# Patient Record
Sex: Female | Born: 1970 | Race: Black or African American | Hispanic: No | Marital: Single | Smoking: Never smoker
Health system: Southern US, Community
[De-identification: ages and names within clinical notes are randomized; demographics above are authoritative.]

---

## 1998-10-03 ENCOUNTER — Emergency Department (HOSPITAL_COMMUNITY): Admission: EM | Admit: 1998-10-03 | Discharge: 1998-10-03 | Payer: Self-pay | Admitting: Emergency Medicine

## 1998-10-06 ENCOUNTER — Inpatient Hospital Stay (HOSPITAL_COMMUNITY): Admission: AD | Admit: 1998-10-06 | Discharge: 1998-10-06 | Payer: Self-pay | Admitting: Obstetrics

## 1998-10-08 ENCOUNTER — Inpatient Hospital Stay (HOSPITAL_COMMUNITY): Admission: AD | Admit: 1998-10-08 | Discharge: 1998-10-08 | Payer: Self-pay | Admitting: Obstetrics

## 1999-04-03 ENCOUNTER — Encounter: Payer: Self-pay | Admitting: *Deleted

## 1999-04-03 ENCOUNTER — Ambulatory Visit (HOSPITAL_COMMUNITY): Admission: AD | Admit: 1999-04-03 | Discharge: 1999-04-03 | Payer: Self-pay | Admitting: *Deleted

## 2000-03-11 ENCOUNTER — Other Ambulatory Visit: Admission: RE | Admit: 2000-03-11 | Discharge: 2000-03-11 | Payer: Self-pay | Admitting: Obstetrics and Gynecology

## 2000-10-07 ENCOUNTER — Inpatient Hospital Stay (HOSPITAL_COMMUNITY): Admission: AD | Admit: 2000-10-07 | Discharge: 2000-10-09 | Payer: Self-pay | Admitting: Obstetrics and Gynecology

## 2010-09-26 ENCOUNTER — Inpatient Hospital Stay (INDEPENDENT_AMBULATORY_CARE_PROVIDER_SITE_OTHER)
Admission: RE | Admit: 2010-09-26 | Discharge: 2010-09-26 | Disposition: A | Discharge status: Home / Self Care | Payer: Self-pay | Source: Ambulatory Visit | Attending: Family Medicine | Admitting: Family Medicine

## 2010-09-26 ENCOUNTER — Ambulatory Visit (INDEPENDENT_AMBULATORY_CARE_PROVIDER_SITE_OTHER): Payer: Self-pay

## 2010-09-26 DIAGNOSIS — J40 Bronchitis, not specified as acute or chronic: Secondary | ICD-10-CM

## 2010-09-26 DIAGNOSIS — J45909 Unspecified asthma, uncomplicated: Secondary | ICD-10-CM

## 2010-11-20 ENCOUNTER — Inpatient Hospital Stay (INDEPENDENT_AMBULATORY_CARE_PROVIDER_SITE_OTHER)
Admission: RE | Admit: 2010-11-20 | Discharge: 2010-11-20 | Disposition: A | Discharge status: Home / Self Care | Payer: Medicaid Other | Source: Ambulatory Visit | Attending: Emergency Medicine | Admitting: Emergency Medicine

## 2010-11-20 ENCOUNTER — Ambulatory Visit (INDEPENDENT_AMBULATORY_CARE_PROVIDER_SITE_OTHER): Payer: Medicaid Other

## 2010-11-20 DIAGNOSIS — J45909 Unspecified asthma, uncomplicated: Secondary | ICD-10-CM

## 2010-11-20 LAB — COMPREHENSIVE METABOLIC PANEL
ALT: 14 U/L (ref 0–35)
AST: 22 U/L (ref 0–37)
Albumin: 3.8 g/dL (ref 3.5–5.2)
Alkaline Phosphatase: 60 U/L (ref 39–117)
BUN: 20 mg/dL (ref 6–23)
CO2: 27 mEq/L (ref 19–32)
Calcium: 8.9 mg/dL (ref 8.4–10.5)
Chloride: 104 mEq/L (ref 96–112)
Creatinine, Ser: 0.76 mg/dL (ref 0.4–1.2)
GFR calc Af Amer: >60 mL/min (ref >60)
GFR calc non Af Amer: >60 mL/min (ref >60)
Glucose, Bld: 80 mg/dL (ref 70–99)
Potassium: 4.2 mEq/L (ref 3.5–5.1)
Sodium: 135 mEq/L (ref 135–145)
Total Bilirubin: 0.7 mg/dL (ref 0.3–1.2)
Total Protein: 7.1 g/dL (ref 6.0–8.3)

## 2010-11-20 LAB — DIFFERENTIAL
Basophils Absolute: 0 10*3/uL (ref 0.0–0.1)
Basophils Relative: 1 % (ref 0–1)
Eosinophils Absolute: 0.1 10*3/uL (ref 0.0–0.7)
Eosinophils Relative: 2 % (ref 0–5)
Lymphocytes Relative: 35 % (ref 12–46)
Lymphs Abs: 1.8 10*3/uL (ref 0.7–4.0)
Monocytes Absolute: 0.4 10*3/uL (ref 0.1–1.0)
Monocytes Relative: 8 % (ref 3–12)
Neutro Abs: 2.9 10*3/uL (ref 1.7–7.7)
Neutrophils Relative %: 55 % (ref 43–77)

## 2010-11-20 LAB — CBC
HCT: 39 % (ref 36.0–46.0)
Hemoglobin: 13.2 g/dL (ref 12.0–15.0)
MCH: 30.1 pg (ref 26.0–34.0)
MCHC: 33.8 g/dL (ref 30.0–36.0)
MCV: 89 fL (ref 78.0–100.0)
Platelets: 190 10*3/uL (ref 150–400)
RBC: 4.38 MIL/uL (ref 3.87–5.11)
RDW: 11.9 % (ref 11.5–15.5)
WBC: 5.3 10*3/uL (ref 4.0–10.5)

## 2010-11-21 LAB — TSH: TSH: 1.778 u[IU]/mL (ref 0.350–4.500)

## 2011-08-19 ENCOUNTER — Telehealth (HOSPITAL_COMMUNITY): Payer: Self-pay | Admitting: *Deleted

## 2011-08-23 ENCOUNTER — Telehealth (HOSPITAL_COMMUNITY): Payer: Self-pay | Admitting: *Deleted

## 2011-12-28 ENCOUNTER — Other Ambulatory Visit: Payer: Self-pay | Admitting: Nephrology

## 2019-01-24 ENCOUNTER — Emergency Department (HOSPITAL_COMMUNITY): Payer: Self-pay

## 2019-01-24 ENCOUNTER — Other Ambulatory Visit: Payer: Self-pay

## 2019-01-24 ENCOUNTER — Encounter (HOSPITAL_COMMUNITY): Payer: Self-pay

## 2019-01-24 ENCOUNTER — Emergency Department (HOSPITAL_COMMUNITY)
Admission: EM | Admit: 2019-01-24 | Discharge: 2019-01-24 | Disposition: A | Discharge status: Home / Self Care | Payer: Self-pay | Attending: Emergency Medicine | Admitting: Emergency Medicine

## 2019-01-24 DIAGNOSIS — K5909 Other constipation: Secondary | ICD-10-CM

## 2019-01-24 LAB — URINALYSIS, ROUTINE W REFLEX MICROSCOPIC
Bacteria, UA: NONE SEEN
Bilirubin Urine: NEGATIVE
Glucose, UA: NEGATIVE mg/dL
Hgb urine dipstick: NEGATIVE
Ketones, ur: 20 mg/dL — AB
Leukocytes,Ua: NEGATIVE
Nitrite: NEGATIVE
Protein, ur: 30 mg/dL — AB
Specific Gravity, Urine: 1.024 (ref 1.005–1.030)
Squamous Epithelial / LPF: >50 — ABNORMAL HIGH (ref 0–5)
pH: 5 (ref 5.0–8.0)

## 2019-01-24 LAB — CBC
HCT: 41.7 % (ref 36.0–46.0)
Hemoglobin: 13.7 g/dL (ref 12.0–15.0)
MCH: 30.4 pg (ref 26.0–34.0)
MCHC: 32.9 g/dL (ref 30.0–36.0)
MCV: 92.5 fL (ref 80.0–100.0)
Platelets: 184 10*3/uL (ref 150–400)
RBC: 4.51 MIL/uL (ref 3.87–5.11)
RDW: 11.7 % (ref 11.5–15.5)
WBC: 5 10*3/uL (ref 4.0–10.5)
nRBC: 0 % (ref 0.0–0.2)

## 2019-01-24 LAB — COMPREHENSIVE METABOLIC PANEL
ALT: 20 U/L (ref 0–44)
AST: 26 U/L (ref 15–41)
Albumin: 4.4 g/dL (ref 3.5–5.0)
Alkaline Phosphatase: 54 U/L (ref 38–126)
Anion gap: 9 (ref 5–15)
BUN: 14 mg/dL (ref 6–20)
CO2: 26 mmol/L (ref 22–32)
Calcium: 9.3 mg/dL (ref 8.9–10.3)
Chloride: 105 mmol/L (ref 98–111)
Creatinine, Ser: 0.59 mg/dL (ref 0.44–1.00)
GFR calc Af Amer: >60 mL/min (ref >60)
GFR calc non Af Amer: >60 mL/min (ref >60)
Glucose, Bld: 78 mg/dL (ref 70–99)
Potassium: 3.6 mmol/L (ref 3.5–5.1)
Sodium: 140 mmol/L (ref 135–145)
Total Bilirubin: 1.3 mg/dL — ABNORMAL HIGH (ref 0.3–1.2)
Total Protein: 7.9 g/dL (ref 6.5–8.1)

## 2019-01-24 LAB — LIPASE, BLOOD: Lipase: 25 U/L (ref 11–51)

## 2019-01-24 LAB — I-STAT BETA HCG BLOOD, ED (MC, WL, AP ONLY): I-stat hCG, quantitative: <5 m[IU]/mL (ref <5)

## 2019-01-24 MED ORDER — SENNA 8.6 MG PO TABS: 1.0000 | ORAL_TABLET | Freq: Two times a day (BID) | ORAL | 0 refills | Status: AC | PRN

## 2019-01-24 MED ORDER — SODIUM CHLORIDE 0.9% FLUSH: 3.0000 mL | Freq: Once | INTRAVENOUS | Status: DC

## 2019-01-24 MED ORDER — POLYETHYLENE GLYCOL 3350 17 GM/SCOOP PO POWD: 17.0000 g | Freq: Every day | ORAL | 0 refills | Status: AC

## 2019-01-24 NOTE — Discharge Instructions (Signed)
Your x-rays show a moderate amount of constipation.  This is best treated with Miralax to soften the stool and Senna to move the stool.  Please take as much Miralax as need to have a comfortable bowel movement, and you can take one to two Senna tablets daily as needed.  Continue to stay active and eat plenty of fiber.

## 2019-01-24 NOTE — ED Triage Notes (Signed)
Pt states that for approximately 3 weeks, she has felt the urge to have a BM but has been unable to get it out per normal. Pt states that she had a BM with a mucous plug. Pt states that her appetite is normal, but she is having some abdominal swelling. Pt states she has also had some fatigue.  Last BM this morning, but very small. RLQ pain that waxes and wanes.

## 2019-01-24 NOTE — ED Provider Notes (Signed)
[facility]-EMERGENCY DEPT Provider Note   CSN: [id number] Arrival date & time: 01/24/19  1528    History   Chief Complaint Chief Complaint  Patient presents with  . Abdominal Pain    HPI Kathy Fuller is a [age] y.o. female without significant past medical history who presents with abdominal pain.  She says that she noticed that her bowel movements decreased in frequency and quantity about 2 months ago.  She connected this with relative inactivity and eating processed foods, so she tried to walk more and more fruits and vegetables.  She also tried over-the-counter stool softeners which she thinks were Dulcolax as well as a fleets enema, neither of which relieved her symptoms or helped her have a bowel movement.  Her symptoms have worsened over the past 2 months.  She has had nausea and a feeling of fullness in her abdomen especially after eating.  She has had some vomiting as well.  She denies fevers but thinks she has had some chills.  She denies blood in her stools and painful defecation but does endorse some tenesmus.  She takes no medications other than occasional Advil.      History reviewed. No pertinent past medical history.  There are no active problems to display for this patient.   History reviewed. No pertinent surgical history.   OB History   No obstetric history on file.      Home Medications    Prior to Admission medications   Medication Sig Start Date End Date Taking? Authorizing Provider  polyethylene glycol powder (GLYCOLAX/MIRALAX) 17 GM/SCOOP powder Take 17 g by mouth daily. 01/24/19   Lennox SoldersWinfrey, Jehieli Brassell C, MD  senna (SENOKOT) 8.6 MG TABS tablet Take 1 tablet (8.6 mg total) by mouth 2 (two) times daily as needed for mild constipation or moderate constipation. 01/24/19   Lennox SoldersWinfrey, Kadynce Bonds C, MD    Family History No family history on file.  Social History Social History   Tobacco Use  . Smoking status: Never Smoker  . Smokeless tobacco:  Never Used  Substance Use Topics  . Alcohol use: Yes    Comment: socially  . Drug use: Never     Allergies   Patient has no known allergies.   Review of Systems Review of Systems  Constitutional: Positive for chills. Negative for activity change, appetite change, fatigue and fever.  HENT: Negative for congestion.   Respiratory: Negative for cough, chest tightness and shortness of breath.   Cardiovascular: Negative for chest pain and palpitations.  Gastrointestinal: Positive for abdominal pain, constipation, nausea and vomiting. Negative for blood in stool.  Genitourinary: Negative for dysuria.  Neurological: Negative for headaches.  Psychiatric/Behavioral: The patient is not nervous/anxious.      Physical Exam Updated Vital Signs BP (!) 114/56   Pulse 68   Temp 98.4 F (36.9 C) (Oral)   Resp 16   Ht 5\' 7"  (1.702 m)   Wt 59 kg   LMP 01/10/2019 (Approximate)   SpO2 100%   BMI 20.36 kg/m   Physical Exam Constitutional:      General: She is not in acute distress.    Appearance: She is well-developed and normal weight. She is not ill-appearing.  HENT:     Head: Normocephalic and atraumatic.     Mouth/Throat:     Mouth: Mucous membranes are moist.  Eyes:     Extraocular Movements: Extraocular movements intact.  Cardiovascular:     Rate and Rhythm: Normal rate and regular rhythm.  Heart sounds: Normal heart sounds.  Pulmonary:     Effort: Pulmonary effort is normal. No respiratory distress.  Abdominal:     General: Abdomen is flat. Bowel sounds are normal. There is no distension.     Palpations: There is no shifting dullness.     Tenderness: There is generalized abdominal tenderness and tenderness in the right lower quadrant. There is no guarding or rebound.  Skin:    General: Skin is warm and dry.  Neurological:     General: No focal deficit present.     Mental Status: She is alert.  Psychiatric:        Mood and Affect: Mood normal.      ED Treatments  / Results  Labs (all labs ordered are listed, but only abnormal results are displayed) Labs Reviewed  COMPREHENSIVE METABOLIC PANEL - Abnormal; Notable for the following components:      Result Value   Total Bilirubin 1.3 (*)    All other components within normal limits  URINALYSIS, ROUTINE W REFLEX MICROSCOPIC - Abnormal; Notable for the following components:   Color, Urine AMBER (*)    APPearance CLOUDY (*)    Ketones, ur 20 (*)    Protein, ur 30 (*)    Squamous Epithelial / LPF >50 (*)    All other components within normal limits  LIPASE, BLOOD  CBC  I-STAT BETA HCG BLOOD, ED ([facility], [facility], [facility] ONLY)    EKG None  Radiology Dg Abdomen Acute W/chest  Result Date: 01/24/2019 CLINICAL DATA:  Right-sided pain for 2 months EXAM: DG ABDOMEN ACUTE W/ 1V CHEST COMPARISON:  11/20/2010 FINDINGS: Cardiac shadow is within normal limits. The lungs are well aerated bilaterally. No focal infiltrate or sizable effusion is seen. Scattered large and small bowel gas is noted. No abnormal mass or abnormal calcifications are seen. No free air is noted. No bony abnormality is noted. IMPRESSION: No acute abnormality seen. Electronically Signed   By: Inez Catalina M.D.   On: 01/24/2019 18:32    Procedures Procedures (including critical care time)  Medications Ordered in ED Medications  sodium chloride flush (NS) 0.9 % injection 3 mL (has no administration in time range)     Initial Impression / Assessment and Plan / ED Course  I have reviewed the triage vital signs and the nursing notes.  Pertinent labs & imaging results that were available during my care of the patient were reviewed by me and considered in my medical decision making (see chart for details).        Patient's history is most consistent with constipation, although she is tender in the right lower quadrant rather than the left lower quadrant, which would be more expected in this case.  Lipase is normal, and CBC is normal, arguing  against pancreatitis and appendicitis respectively.  Will obtain KUB to investigate for possible small bowel obstruction, although this is unlikely, and to assess degree of constipation.  KUB shows a moderate stool burden without fecal impaction.  Patient was prescribed MiraLAX and senna and advised on how to titrate MiraLAX for effect.  She expressed full understanding and was safe for discharge.  Final Clinical Impressions(s) / ED Diagnoses   Final diagnoses:  Other constipation    ED Discharge Orders         Ordered    polyethylene glycol powder (GLYCOLAX/MIRALAX) 17 GM/SCOOP powder  Daily     01/24/19 1843    senna (SENOKOT) 8.6 MG TABS tablet  2 times daily PRN  01/24/19 1843           Lennox SoldersWinfrey, Malin Cervini C, MD 01/24/19 [employer] [id number]    Long, Joshua G, MD 01/25/19 [id number]

## 2019-01-24 NOTE — ED Notes (Signed)
Patient transported to X-ray 

## 2019-07-18 IMAGING — CR DG ABDOMEN ACUTE W/ 1V CHEST
4 series · 4 of 4 positions shown · non-contrast
Comparison: 11/20/2010

CLINICAL DATA: Right-sided pain for 2 months

EXAM:
DG ABDOMEN ACUTE W/ 1V CHEST

[w chest pa]
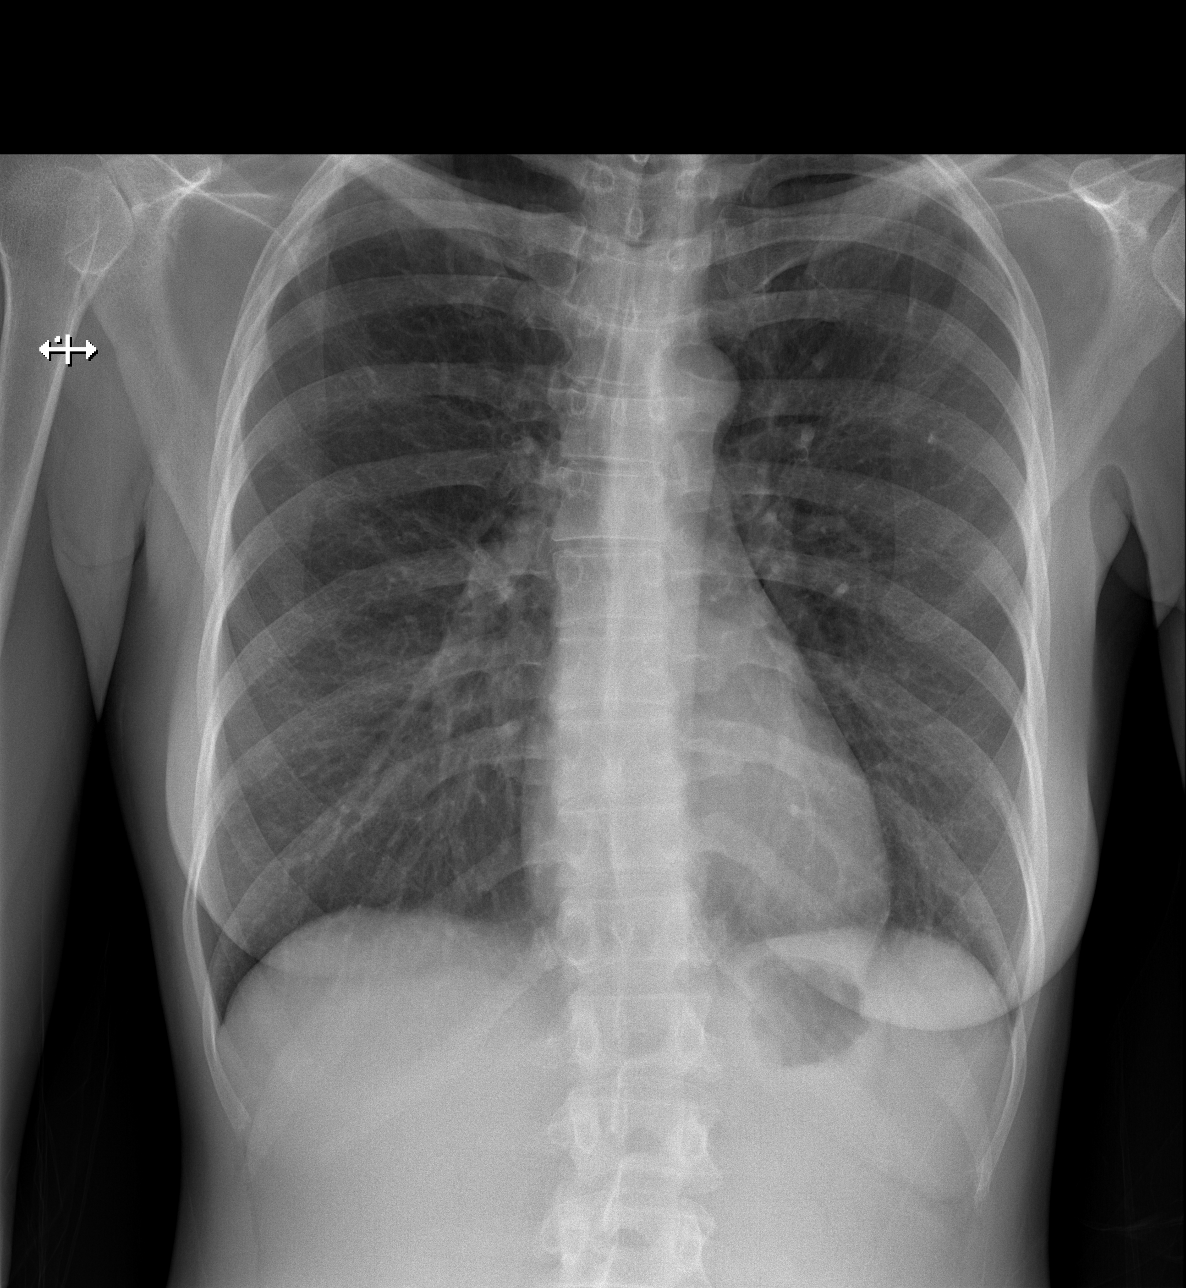

[w abdomen upright]
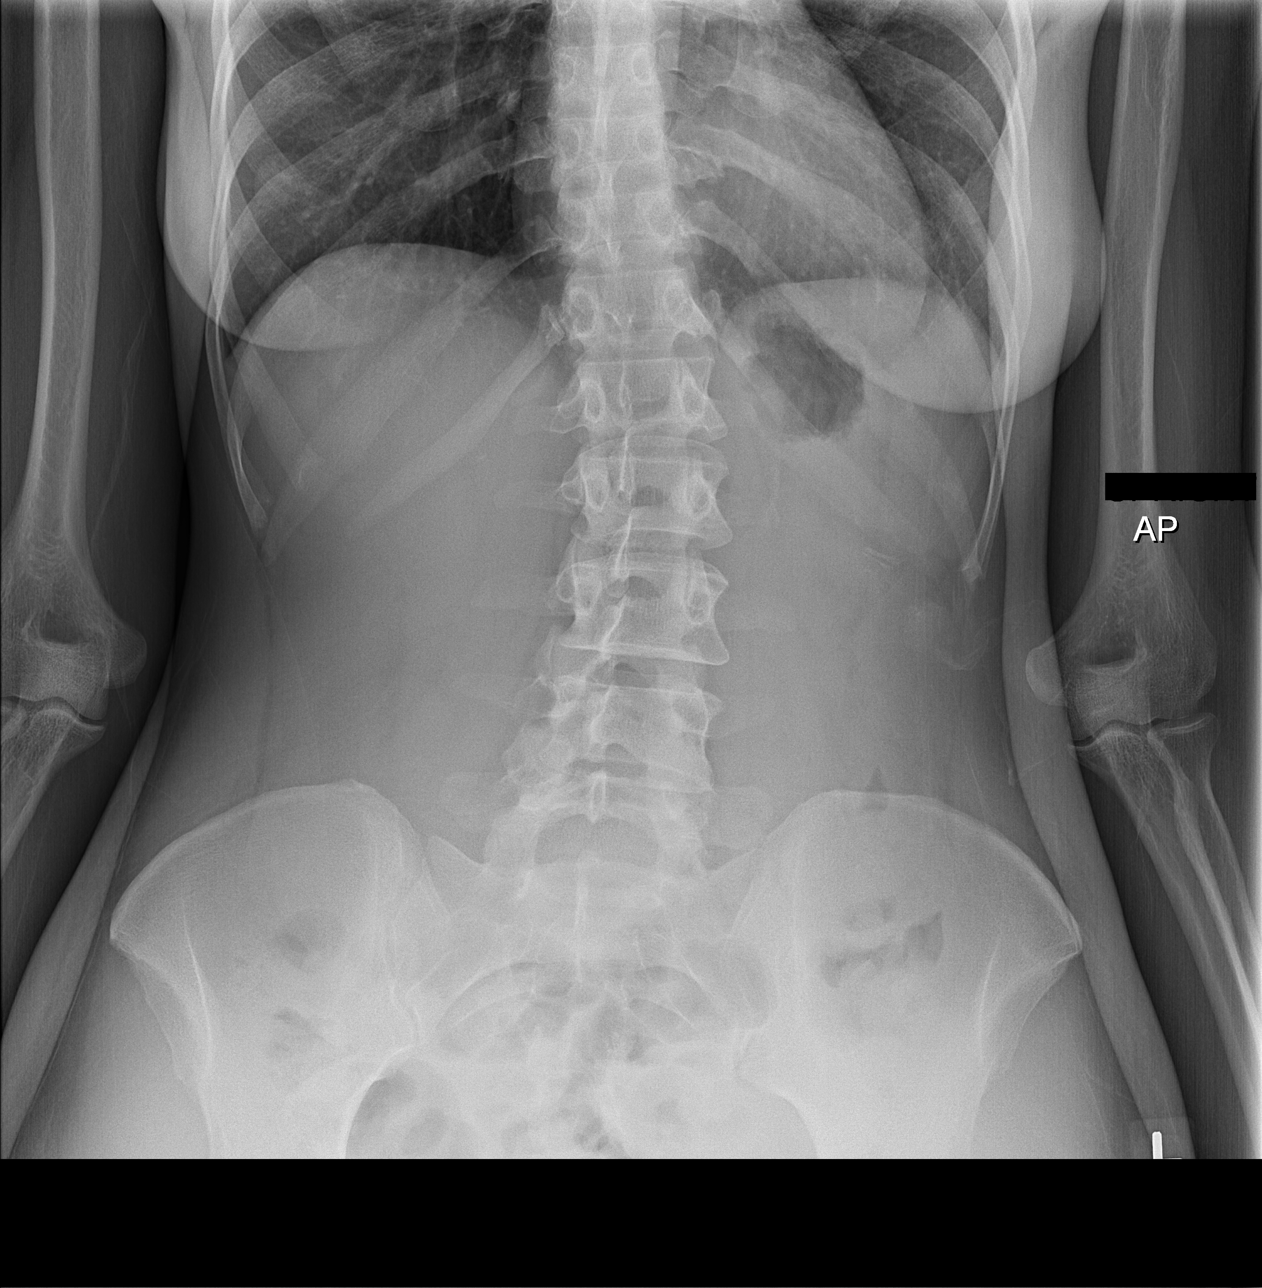

[t abdomen supine (1 of 2)]
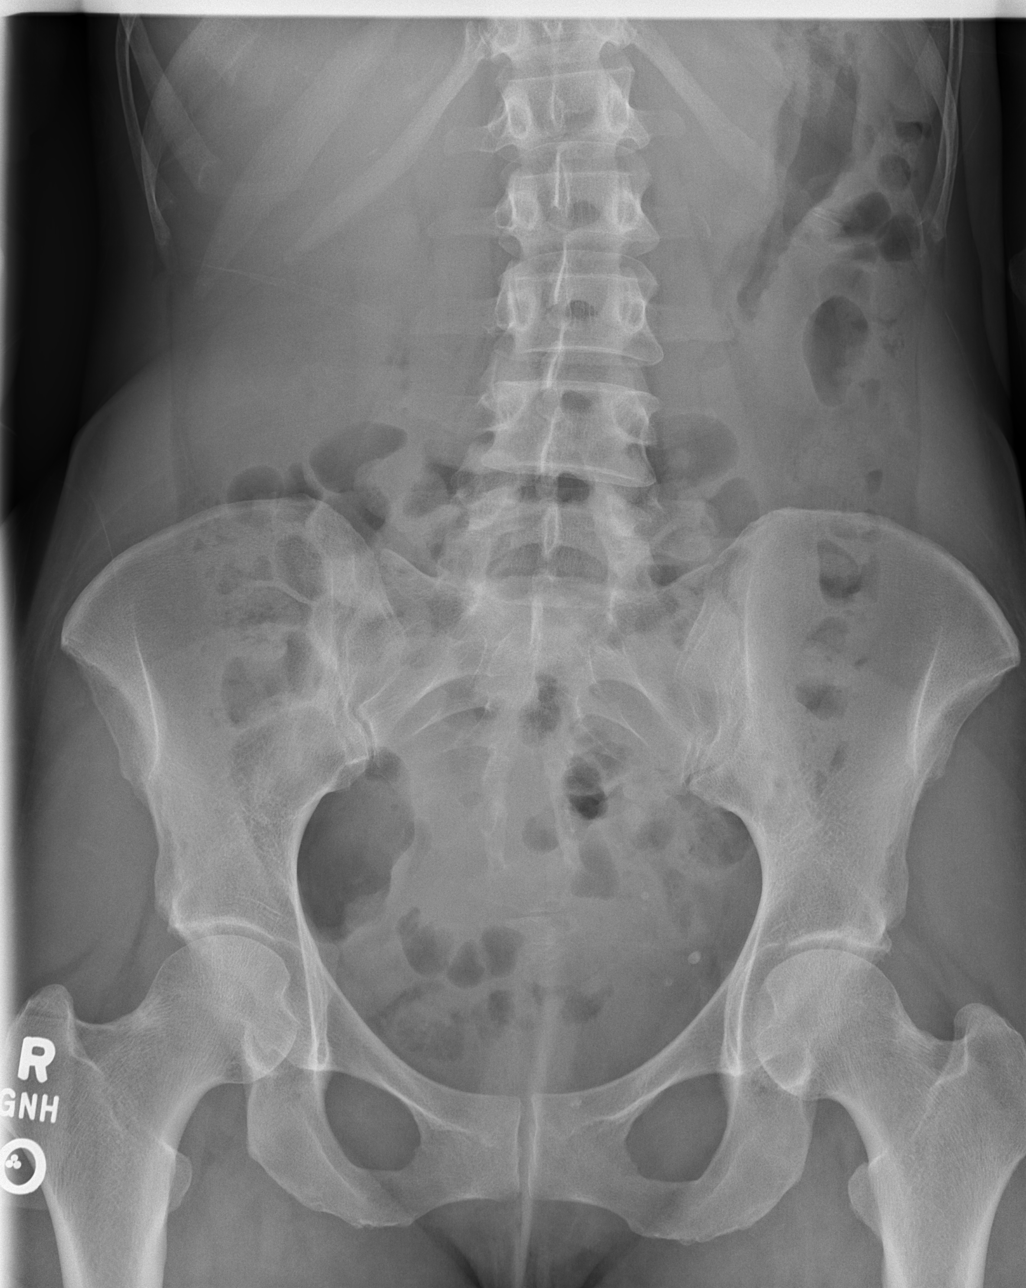

[t abdomen supine (2 of 2)]
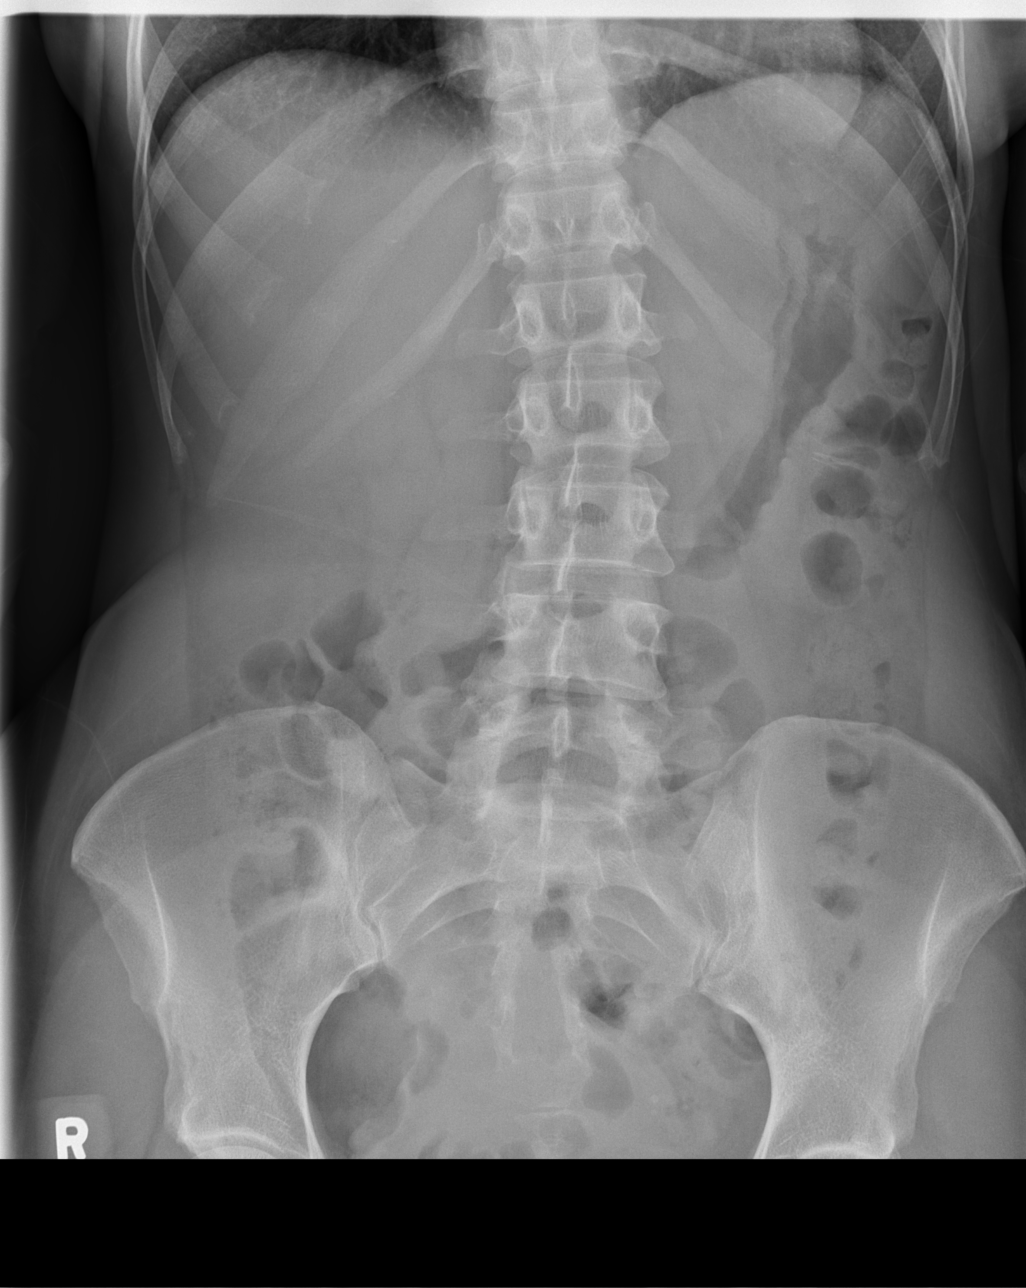

[4 of 4 positions shown; findings below may reference images not displayed]

FINDINGS: Cardiac shadow is within normal limits. The lungs are well aerated
bilaterally. No focal infiltrate or sizable effusion is seen.

Scattered large and small bowel gas is noted. No abnormal mass or
abnormal calcifications are seen. No free air is noted. No bony
abnormality is noted.
IMPRESSION: No acute abnormality seen.

## 2019-07-23 ENCOUNTER — Ambulatory Visit: Payer: Medicaid Other | Attending: Internal Medicine

## 2019-07-23 DIAGNOSIS — Z20828 Contact with and (suspected) exposure to other viral communicable diseases: Secondary | ICD-10-CM | POA: Insufficient documentation

## 2019-07-23 DIAGNOSIS — Z20822 Contact with and (suspected) exposure to covid-19: Secondary | ICD-10-CM

## 2019-07-25 LAB — NOVEL CORONAVIRUS, NAA: SARS-CoV-2, NAA: NOT DETECTED

## 2022-05-21 ENCOUNTER — Other Ambulatory Visit: Payer: Self-pay

## 2022-05-21 DIAGNOSIS — N6310 Unspecified lump in the right breast, unspecified quadrant: Secondary | ICD-10-CM

## 2023-03-16 ENCOUNTER — Encounter: Payer: Self-pay | Admitting: Women's Health

## 2023-03-16 ENCOUNTER — Other Ambulatory Visit: Payer: Self-pay | Admitting: Women's Health

## 2023-03-16 DIAGNOSIS — N6310 Unspecified lump in the right breast, unspecified quadrant: Secondary | ICD-10-CM

## 2023-04-06 ENCOUNTER — Other Ambulatory Visit: Payer: Medicaid Other

## 2024-04-18 ENCOUNTER — Other Ambulatory Visit: Payer: Self-pay

## 2024-04-18 DIAGNOSIS — R102 Pelvic and perineal pain: Secondary | ICD-10-CM

## 2024-04-18 DIAGNOSIS — Z0000 Encounter for general adult medical examination without abnormal findings: Secondary | ICD-10-CM

## 2024-08-22 ENCOUNTER — Encounter (HOSPITAL_COMMUNITY): Payer: Self-pay

## 2024-08-22 ENCOUNTER — Ambulatory Visit (HOSPITAL_COMMUNITY): Payer: MEDICAID
